# Patient Record
Sex: Male | Born: 1985 | Race: White | Hispanic: No | Marital: Married | Smoking: Never smoker
Health system: Southern US, Community
[De-identification: ages and names within clinical notes are randomized; demographics above are authoritative.]

---

## 2018-09-24 ENCOUNTER — Other Ambulatory Visit: Payer: Self-pay

## 2018-09-24 ENCOUNTER — Emergency Department (INDEPENDENT_AMBULATORY_CARE_PROVIDER_SITE_OTHER)
Admission: EM | Admit: 2018-09-24 | Discharge: 2018-09-24 | Disposition: A | Discharge status: Home / Self Care | Payer: BLUE CROSS/BLUE SHIELD | Source: Home / Self Care | Attending: Emergency Medicine | Admitting: Emergency Medicine

## 2018-09-24 DIAGNOSIS — K219 Gastro-esophageal reflux disease without esophagitis: Secondary | ICD-10-CM | POA: Diagnosis not present

## 2018-09-24 DIAGNOSIS — R0789 Other chest pain: Secondary | ICD-10-CM

## 2018-09-24 MED ORDER — ESOMEPRAZOLE MAGNESIUM 40 MG PO CPDR: 40.0000 mg | DELAYED_RELEASE_CAPSULE | Freq: Every day | ORAL | 0 refills | Status: AC

## 2018-09-24 MED ORDER — ALUM & MAG HYDROXIDE-SIMETH 200-200-20 MG/5ML PO SUSP
30.0000 mL | Freq: Once | ORAL | Status: AC
  Administered 2018-09-24: 30 mL via ORAL

## 2018-09-24 NOTE — ED Triage Notes (Signed)
Pt states that chest tightness started last night.  He feels that his arms are weak, and has had nausea this am.  Pressure in mid chest to left chest today, arm weakness, and "air-headed" now.

## 2018-09-24 NOTE — ED Provider Notes (Signed)
[name]    CSN: [id number] Arrival date & time: 09/24/18  1039     History   Chief Complaint Chief Complaint  Patient presents with  . Chest Pain    HPI Jesse Ramos is a 33 y.o. male.   The history is provided by the patient.  Chest Pain Chest pain location: Vague sensation of mild tightness anterior chest, mid, right, and left and epigastric area.  He is unable to specify further. Pain quality: burning, dull and tightness   Pain radiates to:  Does not radiate Pain severity now: Mild to moderate, varies between 3 or 4 out of 10. Onset quality:  Gradual Timing:  Intermittent Progression:  Waxing and waning Chronicity:  New Context: eating (Ate [place] food last night, causing some epigastric burning" heartburn" and reflux and belching), at rest and stress (Wife is [redacted] weeks pregnant with second child.  [facility]  No other specific stressors that he can identify)   Context: not breathing, not drug use, not lifting, not movement and not trauma   Relieved by: Standing position. Exacerbated by: Lying down. Ineffective treatments:  None tried Associated symptoms: dizziness (No vertigo.  Occasionally vague feeling of lightheadedness.  No syncope.), fatigue, heartburn and numbness (Vague intermittent feeling of numbness in hands and soreness muscles of right and left upper extremities)   Associated symptoms: no abdominal pain (No abdominal pain except occasional epigastric burning sensation), no altered mental status, no back pain, no claudication, no cough, no diaphoresis, no dysphagia, no fever, no headache, no lower extremity edema, no nausea, no palpitations, no shortness of breath, no syncope, no vomiting and no weakness   Risk factors: no coronary artery disease, no diabetes mellitus, no high cholesterol, no hypertension, no prior DVT/PE and no smoking    Denies exertional chest pain. History reviewed. No pertinent past medical  history. He states he has reflux symptoms once or twice a week for many months, can be exacerbated by overeating or eating spicy or greasy foods. There are no active problems to display for this patient.   History reviewed. No pertinent surgical history.  Denies prior surgery.   Home Medications    Prior to Admission medications   Medication Sig Start Date End Date Taking? Authorizing Provider  esomeprazole (NEXIUM) 40 MG capsule Take 1 capsule (40 mg total) by mouth daily. 09/24/18   Lajean ManesMassey, David, MD    Family History History reviewed. No pertinent family history. No family history of coronary disease or cardiovascular disease in parents or siblings. Social History Social History   Tobacco Use  . Smoking status: Never Smoker  . Smokeless tobacco: Never Used  Substance Use Topics  . Alcohol use: Yes    Comment: 1/month  . Drug use: Not Currently     Allergies   Patient has no known allergies.   Review of Systems Review of Systems  Constitutional: Positive for fatigue. Negative for chills, diaphoresis and fever.  HENT: Negative for congestion and trouble swallowing.   Respiratory: Negative for cough, choking and shortness of breath.   Cardiovascular: Positive for chest pain. Negative for palpitations, claudication and syncope.  Gastrointestinal: Positive for heartburn. Negative for abdominal pain (No abdominal pain except occasional epigastric burning sensation), blood in stool, diarrhea, nausea and vomiting.  Genitourinary: Negative for difficulty urinating.  Musculoskeletal: Negative for back pain.  Skin: Negative for rash and wound.  Neurological: Positive for dizziness (No vertigo.  Occasionally vague feeling of lightheadedness.  No syncope.) and numbness (Vague intermittent feeling of numbness in hands and soreness muscles of right and left upper extremities). Negative for weakness and headaches.  Psychiatric/Behavioral: Positive for sleep disturbance (He works  third shift.  Was off last night and had some difficulty trying to sleep last night.).  All other systems reviewed and are negative.    Physical Exam Triage Vital Signs ED Triage Vitals [09/24/18 1104]  Enc Vitals Group     BP (!) 145/84     Pulse Rate (!) 58     Resp 20     Temp      Temp src      SpO2 98 %     Weight      Height      Head Circumference      Peak Flow      Pain Score 4     Pain Loc      Pain Edu?      Excl. in GC?    No data found.  Updated Vital Signs BP (!) 145/84 (BP Location: Right Arm)   Pulse (!) 58   Resp 20   SpO2 98%    Physical Exam Vitals signs and nursing note reviewed.  Constitutional:      General: He is not in acute distress.    Appearance: He is well-developed. He is not toxic-appearing.     Comments: Pleasant male.  Mildly overweight.  Cooperative.  No acute distress.  HENT:     Head: Normocephalic and atraumatic.  Eyes:     Extraocular Movements: Extraocular movements intact.     Pupils: Pupils are equal, round, and reactive to light.  Neck:     Musculoskeletal: Normal range of motion and neck supple.     Vascular: No JVD.     Trachea: No tracheal deviation.  Cardiovascular:     Rate and Rhythm: Normal rate and regular rhythm.  No extrasystoles are present.    Pulses:          Carotid pulses are 2+ on the right side and 2+ on the left side.    Heart sounds: Normal heart sounds. No murmur. No friction rub. No gallop.   Pulmonary:     Effort: Pulmonary effort is normal. No accessory muscle usage or respiratory distress.     Breath sounds: Normal breath sounds. No decreased breath sounds, wheezing or rales.  Chest:     Chest wall: No tenderness.  Abdominal:     General: Bowel sounds are normal.     Palpations: Abdomen is soft. There is no hepatomegaly.     Tenderness: There is no abdominal tenderness.  Musculoskeletal:     Right lower leg: He exhibits no tenderness. No edema.     Left lower leg: He exhibits no  tenderness. No edema.     Comments: Upper extremities within normal limits.  Full range of motion.  No joint swelling or tenderness.  Mild muscle tenderness of the upper arm and shoulder muscles bilaterally but no bony tenderness.  Neurovascular distally intact.  Capillary refill distally both hands normal.  Lymphadenopathy:     Cervical: No cervical adenopathy.  Skin:    General: Skin is warm and dry.     Capillary Refill: Capillary refill takes less than 2 seconds.     Findings: No rash.  Neurological:     General: No focal deficit present.     Mental Status: He is alert and oriented to person, place, and time.  Cranial Nerves: No cranial nerve deficit.     Sensory: Sensation is intact.     Motor: No weakness.     Gait: Gait is intact.      UC Treatments / Results  Labs (all labs ordered are listed, but only abnormal results are displayed) Labs Reviewed - No data to display  EKG   Radiology No results found.  Procedures Procedures (including critical care time)  Medications Ordered in UC Medications  alum & mag hydroxide-simeth (MAALOX/MYLANTA) 200-200-20 MG/5ML suspension 30 mL (30 mLs Oral Given 09/24/18 1121)    Initial Impression / Assessment and Plan / UC Course  I have reviewed the triage vital signs and the nursing notes.  Pertinent labs & imaging results that were available during my care of the patient were reviewed by me and considered in my medical decision making (see chart for details).  Clinical Course as of Sep 25 1643  Wed Sep 24, 2018  1115 History taken and patient examined.  EKG within normal limits.  No acute abnormalities. Pulse ox 98%.-I strongly suspect this is gastritis and reflux.  We will give GI cocktail stat, then reevaluate   [DM]    Clinical Course User Index [DM] Jacqulyn Cane, MD   He was reevaluated.  Chest discomfort improved somewhat and burning epigastric feeling improved somewhat. He stated that he still felt fatigued, I  rechecked BP 132/76, pulse 72 and regular.  Pulse ox 98% on room air.  I reviewed with him that his EKG was within normal limits.  Normal sinus rhythm without any acute abnormalities.  Final Clinical Impressions(s) / UC Diagnoses   Final diagnoses:  Atypical chest pain  Gastroesophageal reflux disease, esophagitis presence not specified     Discharge Instructions     Here in urgent care, your EKG was normal.  Physical exam normal.  Normal blood pressure and normal oxygen level. There is no evidence that your symptoms are from your heart. Most likely, your symptoms are from acid reflux from your stomach coming up your esophagus, causing chest discomfort and other symptoms. Please read information sheets attached, on nonspecific chest pain and gastroesophageal reflux. Stay away from greasy or spicy food. I printed a prescription for Nexium, 1 daily for 15 days cooldown stomach and esophagus acid. You need to follow-up with your PCP within the next 7 to 14 days. If any severe or worsening or new symptoms, go to emergency room.    ED Prescriptions    Medication Sig Dispense Auth. Provider   esomeprazole (NEXIUM) 40 MG capsule Take 1 capsule (40 mg total) by mouth daily. 15 capsule Jacqulyn Cane, MD     AVS printed and reviewed with patient at length, see details above. Also verbal instructions given. Explained there is no sign of any acute heart or lung event. I explained the diagnosis is likely gastroesophageal reflux. Also we discussed the possibility that stress from wife's pregnancy and irregular sleeping habits from working third shift, could possibly be factors in his symptoms, however that would be a diagnosis of exclusion.  Follow-up with your primary care doctor in 5-7 days, or emergency room sooner if symptoms become worse or new symptoms. Precautions discussed. Red flags discussed. Questions invited and answered. Patient voiced understanding and agreement.      Jacqulyn Cane, MD 09/25/18 [phone]

## 2018-09-24 NOTE — Discharge Instructions (Addendum)
Here in urgent care, your EKG was normal.  Physical exam normal.  Normal blood pressure and normal oxygen level. There is no evidence that your symptoms are from your heart. Most likely, your symptoms are from acid reflux from your stomach coming up your esophagus, causing chest discomfort and other symptoms. Please read information sheets attached, on nonspecific chest pain and gastroesophageal reflux. Stay away from greasy or spicy food. I printed a prescription for Nexium, 1 daily for 15 days cooldown stomach and esophagus acid. You need to follow-up with your PCP within the next 7 to 14 days. If any severe or worsening or new symptoms, go to emergency room.

## 2018-09-25 ENCOUNTER — Telehealth: Payer: Self-pay

## 2018-09-25 NOTE — Telephone Encounter (Signed)
Pt did not go to the hospital.  Not feeling as much pressure, but still there.  Dizziness is better also.  Encouraged to follow up in ER if sx worsen.

## 2018-10-04 ENCOUNTER — Emergency Department (HOSPITAL_COMMUNITY)
Admission: EM | Admit: 2018-10-04 | Discharge: 2018-10-04 | Disposition: A | Discharge status: Home / Self Care | Payer: BLUE CROSS/BLUE SHIELD | Attending: Emergency Medicine | Admitting: Emergency Medicine

## 2018-10-04 ENCOUNTER — Emergency Department (HOSPITAL_COMMUNITY): Payer: BLUE CROSS/BLUE SHIELD

## 2018-10-04 ENCOUNTER — Other Ambulatory Visit: Payer: Self-pay

## 2018-10-04 ENCOUNTER — Encounter (HOSPITAL_COMMUNITY): Payer: Self-pay | Admitting: Emergency Medicine

## 2018-10-04 DIAGNOSIS — Z79899 Other long term (current) drug therapy: Secondary | ICD-10-CM | POA: Diagnosis not present

## 2018-10-04 DIAGNOSIS — R1031 Right lower quadrant pain: Secondary | ICD-10-CM | POA: Diagnosis present

## 2018-10-04 LAB — COMPREHENSIVE METABOLIC PANEL
ALT: 38 U/L (ref 0–44)
AST: 24 U/L (ref 15–41)
Albumin: 4.5 g/dL (ref 3.5–5.0)
Alkaline Phosphatase: 45 U/L (ref 38–126)
Anion gap: 10 (ref 5–15)
BUN: 16 mg/dL (ref 6–20)
CO2: 23 mmol/L (ref 22–32)
Calcium: 9.7 mg/dL (ref 8.9–10.3)
Chloride: 107 mmol/L (ref 98–111)
Creatinine, Ser: 0.92 mg/dL (ref 0.61–1.24)
GFR calc Af Amer: >60 mL/min (ref >60)
GFR calc non Af Amer: >60 mL/min (ref >60)
Glucose, Bld: 99 mg/dL (ref 70–99)
Potassium: 4 mmol/L (ref 3.5–5.1)
Sodium: 140 mmol/L (ref 135–145)
Total Bilirubin: 1 mg/dL (ref 0.3–1.2)
Total Protein: 7.4 g/dL (ref 6.5–8.1)

## 2018-10-04 LAB — URINALYSIS, ROUTINE W REFLEX MICROSCOPIC
Bilirubin Urine: NEGATIVE
Glucose, UA: NEGATIVE mg/dL
Hgb urine dipstick: NEGATIVE
Ketones, ur: NEGATIVE mg/dL
Leukocytes,Ua: NEGATIVE
Nitrite: NEGATIVE
Protein, ur: NEGATIVE mg/dL
Specific Gravity, Urine: 1.012 (ref 1.005–1.030)
pH: 7 (ref 5.0–8.0)

## 2018-10-04 LAB — CBC
HCT: 48.7 % (ref 39.0–52.0)
Hemoglobin: 16.6 g/dL (ref 13.0–17.0)
MCH: 28.5 pg (ref 26.0–34.0)
MCHC: 34.1 g/dL (ref 30.0–36.0)
MCV: 83.7 fL (ref 80.0–100.0)
Platelets: 262 10*3/uL (ref 150–400)
RBC: 5.82 MIL/uL — ABNORMAL HIGH (ref 4.22–5.81)
RDW: 12.5 % (ref 11.5–15.5)
WBC: 7.8 10*3/uL (ref 4.0–10.5)
nRBC: 0 % (ref 0.0–0.2)

## 2018-10-04 LAB — LIPASE, BLOOD: Lipase: 37 U/L (ref 11–51)

## 2018-10-04 MED ORDER — IOHEXOL 300 MG/ML  SOLN
100.0000 mL | Freq: Once | INTRAMUSCULAR | Status: AC | PRN
  Administered 2018-10-04: 100 mL via INTRAVENOUS

## 2018-10-04 MED ORDER — SODIUM CHLORIDE 0.9% FLUSH: 3.0000 mL | Freq: Once | INTRAVENOUS | Status: DC

## 2018-10-04 NOTE — ED Triage Notes (Signed)
Pt. Stated, I started having right side pain last night like a cramping feeling all night. This morning I was a little nausea, and then my right side feels like cold and start aching, and its gotten worse.

## 2018-10-04 NOTE — Discharge Instructions (Signed)
You have been seen today for abdominal pain. Please read and follow all provided instructions. Return to the emergency room for worsening condition or new concerning symptoms.    Your blood work today did not show any significant findings. Your CT scan did show possible mesenteric adenitis as well as nonalcoholic fatty liver disease.  I have included information about these diagnoses in your discharge paperwork.  Please read them for more information.  These are not conditions that are treated with antibiotics.  1. Medications:  -You can take Tylenol and ibuprofen as needed for pain.  Please take as directed on the bottle.  2. Treatment: rest, drink plenty of fluids.  You can apply heat to your abdomen if that helps with pain relief.  3. Follow Up: Please follow up with your primary doctor in 2-5 days for discussion of your diagnoses and further evaluation after today's visit; Call today to arrange your follow up.  If you do not have a primary care doctor use the resource guide provided to find one;   ?

## 2018-10-04 NOTE — ED Notes (Signed)
Patient back from CT.

## 2018-10-04 NOTE — ED Notes (Signed)
Pt able to ingest food and fluids. Denied N/V/D. No abd pain.

## 2018-10-04 NOTE — ED Notes (Signed)
Patient transported to CT 

## 2018-10-04 NOTE — ED Provider Notes (Signed)
[facility]ENC[facility] Provider Note   CSN: [id number] Arrival date & time: 10/04/18  1255    History   Chief Complaint Chief Complaint  Patient presents with  . Abdominal Pain    right    HPI Jesse ElbeJoseph Ramos is a 33 y.o. male.     HPI   33 year old male presents today with complaints of abdominal pain.  Patient notes yesterday after eating dinner he developed a cramping sensation is right lower quadrant.  He notes this came and went throughout the night.  He notes today he still feels discomfort and describes it as an ache in the right lower quadrant.  He notes normal bowel movements, normal urination, no fever nausea or vomiting.  No medications prior to arrival.  Last p.o. intake was crackers and water at 11 AM today.  No history of abdominal surgeries.  He denies any chronic health conditions.   History reviewed. No pertinent past medical history.  There are no active problems to display for this patient.   History reviewed. No pertinent surgical history.      Home Medications    Prior to Admission medications   Medication Sig Start Date End Date Taking? Authorizing Provider  Ascorbic Acid (VITAMIN C PO) Take 1 tablet by mouth daily.   Yes [provider]  clobetasol ointment (TEMOVATE) 0.05 % Apply 1 application topically daily as needed (ezcema flares).   Yes [provider]  esomeprazole (NEXIUM) 40 MG capsule Take 1 capsule (40 mg total) by mouth daily. Patient taking differently: Take 40 mg by mouth daily as needed (indigestion/hearburn/acid reflux).  09/24/18  Yes Lajean ManesMassey, David, MD  Omega-3 Fatty Acids (FISH OIL PO) Take 1 capsule by mouth daily.   Yes [provider]    Family History No family history on file.  Social History Social History   Tobacco Use  . Smoking status: Never Smoker  . Smokeless tobacco: Never Used  Substance Use Topics  . Alcohol use: Yes    Comment: 1/month  . Drug use: Not  Currently     Allergies   Patient has no known allergies.   Review of Systems Review of Systems  All other systems reviewed and are negative.   Physical Exam Updated Vital Signs BP 107/64   Pulse (!) 49   Temp 98.2 F (36.8 C) (Oral)   Resp 14   SpO2 97%   Physical Exam Vitals signs and nursing note reviewed.  Constitutional:      Appearance: He is well-developed.  HENT:     Head: Normocephalic and atraumatic.  Eyes:     General: No scleral icterus.       Right eye: No discharge.        Left eye: No discharge.     Conjunctiva/sclera: Conjunctivae normal.     Pupils: Pupils are equal, round, and reactive to light.  Neck:     Musculoskeletal: Normal range of motion.     Vascular: No JVD.     Trachea: No tracheal deviation.  Pulmonary:     Effort: Pulmonary effort is normal.     Breath sounds: No stridor.  Abdominal:     Comments: Minimal tenderness palpation of right lower quadrant no rebound guarding or masses noted, remainder abdomen soft nontender  Neurological:     Mental Status: He is alert and oriented to person, place, and time.     Coordination: Coordination normal.  Psychiatric:        Behavior: Behavior  normal.        Thought Content: Thought content normal.        Judgment: Judgment normal.     ED Treatments / Results  Labs (all labs ordered are listed, but only abnormal results are displayed) Labs Reviewed  CBC - Abnormal; Notable for the following components:      Result Value   RBC 5.82 (*)    All other components within normal limits  URINALYSIS, ROUTINE W REFLEX MICROSCOPIC - Abnormal; Notable for the following components:   Color, Urine STRAW (*)    All other components within normal limits  LIPASE, BLOOD  COMPREHENSIVE METABOLIC PANEL    EKG None  Radiology No results found.  Procedures Procedures (including critical care time)  Medications Ordered in ED Medications  sodium chloride flush (NS) 0.9 % injection 3 mL (3 mLs  Intravenous Not Given 10/04/18 1437)     Initial Impression / Assessment and Plan / ED Course  I have reviewed the triage vital signs and the nursing notes.  Pertinent labs & imaging results that were available during my care of the patient were reviewed by me and considered in my medical decision making (see chart for details).         Assessment/Plan: 33 year old male presents today with complaints of abdominal pain.  He does have pain in the right lower quadrant.  Will obtain CT scan to rule out acute appendicitis.  Also consider nephrolithiasis.  He is afebrile with no elevated white count.  Patient CARE signed to oncoming provider pending CT scan and disposition.   Final Clinical Impressions(s) / ED Diagnoses   Final diagnoses:  RLQ abdominal pain    ED Discharge Orders    None       Francee Gentile 10/06/18 Kersey, Annada, DO 10/07/18 2351

## 2018-10-04 NOTE — ED Notes (Signed)
Patient verbalizes understanding of discharge instructions. Opportunity for questioning and answers were provided. Armband removed by staff, pt discharged from ED.  

## 2018-10-04 NOTE — ED Provider Notes (Signed)
Care assumed from J. Hedges PA-C.  Please see his full H&P.  In short,  Jesse Ramos is a 33 y.o. male presents for right lower quadrant abdominal pain.  Work-up by previous provider is unremarkable including CBC, CMP, lipase, UA.  Patient declines pain medication.  CT abdomen pelvis pending.  Will dispo accordingly.  Physical Exam  BP 107/64   Pulse (!) 49   Temp 98.2 F (36.8 C) (Oral)   Resp 14   SpO2 97%   Physical Exam  PE: Constitutional: well-developed, well-nourished, no apparent distress HENT: normocephalic, atraumatic. no cervical adenopathy Cardiovascular: normal rate and rhythm, distal pulses intact Pulmonary/Chest: effort normal; breath sounds clear and equal bilaterally; no wheezes or rales Abdominal: soft and non distended.  Minimal tenderness palpation of right lower quadrant, no rebound or guarding, no peritoneal signs. Musculoskeletal: full ROM, no edema Neurological: alert with goal directed thinking Skin: warm and dry, no rash, no diaphoresis Psychiatric: normal mood and affect, normal behavior    ED Course/Procedures   Results for orders placed or performed during the hospital encounter of 10/04/18 (from the past 24 hour(s))  Lipase, blood     Status: None   Collection Time: 10/04/18  2:23 PM  Result Value Ref Range   Lipase 37 11 - 51 U/L  Comprehensive metabolic panel     Status: None   Collection Time: 10/04/18  2:23 PM  Result Value Ref Range   Sodium 140 135 - 145 mmol/L   Potassium 4.0 3.5 - 5.1 mmol/L   Chloride 107 98 - 111 mmol/L   CO2 23 22 - 32 mmol/L   Glucose, Bld 99 70 - 99 mg/dL   BUN 16 6 - 20 mg/dL   Creatinine, Ser 1.610.92 0.61 - 1.24 mg/dL   Calcium 9.7 8.9 - 09.610.3 mg/dL   Total Protein 7.4 6.5 - 8.1 g/dL   Albumin 4.5 3.5 - 5.0 g/dL   AST 24 15 - 41 U/L   ALT 38 0 - 44 U/L   Alkaline Phosphatase 45 38 - 126 U/L   Total Bilirubin 1.0 0.3 - 1.2 mg/dL   GFR calc non Af Amer >60 >60 mL/min   GFR calc Af Amer >60 >60 mL/min   Anion  gap 10 5 - 15  CBC     Status: Abnormal   Collection Time: 10/04/18  2:23 PM  Result Value Ref Range   WBC 7.8 4.0 - 10.5 K/uL   RBC 5.82 (H) 4.22 - 5.81 MIL/uL   Hemoglobin 16.6 13.0 - 17.0 g/dL   HCT 04.548.7 40.939.0 - 81.152.0 %   MCV 83.7 80.0 - 100.0 fL   MCH 28.5 26.0 - 34.0 pg   MCHC 34.1 30.0 - 36.0 g/dL   RDW 91.412.5 78.211.5 - 95.615.5 %   Platelets 262 150 - 400 K/uL   nRBC 0.0 0.0 - 0.2 %  Urinalysis, Routine w reflex microscopic     Status: Abnormal   Collection Time: 10/04/18  2:36 PM  Result Value Ref Range   Color, Urine STRAW (A) YELLOW   APPearance CLEAR CLEAR   Specific Gravity, Urine 1.012 1.005 - 1.030   pH 7.0 5.0 - 8.0   Glucose, UA NEGATIVE NEGATIVE mg/dL   Hgb urine dipstick NEGATIVE NEGATIVE   Bilirubin Urine NEGATIVE NEGATIVE   Ketones, ur NEGATIVE NEGATIVE mg/dL   Protein, ur NEGATIVE NEGATIVE mg/dL   Nitrite NEGATIVE NEGATIVE   Leukocytes,Ua NEGATIVE NEGATIVE   CT ABDOMEN AND PELVIS WITH CONTRAST  TECHNIQUE:  Multidetector CT imaging of the abdomen and pelvis was performed  using the standard protocol following bolus administration of  intravenous contrast.    CONTRAST: 163mL OMNIPAQUE IOHEXOL 300 MG/ML SOLN    COMPARISON: None.    FINDINGS:  Lower chest: No acute abnormality.    Hepatobiliary: Liver is low in density suggesting fatty  infiltration. No focal liver abnormality is identified. Gallbladder  appears normal. No bile duct dilatation.    Pancreas: Unremarkable. No pancreatic ductal dilatation or  surrounding inflammatory changes.    Spleen: Normal in size without focal abnormality.    Adrenals/Urinary Tract: Adrenal glands appear normal. Small renal  cysts bilaterally. Kidneys otherwise unremarkable without suspicious  mass, stone or hydronephrosis. No perinephric fluid. No ureteral or  bladder calculi identified. Bladder is unremarkable, partially  decompressed.    Stomach/Bowel: No dilated large or small bowel loops. No  evidence of  bowel wall inflammation. Appendix is normal. Stomach is  unremarkable, partially decompressed.    Vascular/Lymphatic: No vascular abnormality. Mildly prominent lymph  nodes within the RIGHT lower quadrant mesentery, raising the  possibility of a mild mesenteric adenitis.    Reproductive: Prostate is unremarkable.    Other: No free fluid or abscess collection. No free intraperitoneal  air.    Musculoskeletal: No acute or suspicious osseous finding.    IMPRESSION:  1. Normal appendix.  2. Mildly prominent lymph nodes within the RIGHT lower quadrant  mesentery, raising the possibility of a mild mesenteric adenitis.  3. No bowel obstruction or evidence of bowel wall inflammation.  Fluid is present within the nondistended small bowel of the lower  abdomen and upper pelvis which can be a secondary sign of underlying  enteritis.  4. Fatty infiltration of the liver.  5. Remainder of the abdomen and pelvis CT is unremarkable, as  detailed above.      Electronically Signed  By: Franki Cabot M.D.  On: 10/04/2018 19:24      MDM  Patient presents to the ED with complaints of abdominal pain. Patient nontoxic appearing, in no apparent distress, vitals WNL. On exam patient tender to right lower quadrant, no peritoneal signs.  Labs were obtained by previous provider and are unremarkable.  Patient declines any analgesics or anti-emetics.  CT abdomen pelvis shows right mesenteric adenitis, doubt infiltration of the liver and possible enteritis.  No signs of appendicitis or small bowel obstruction.  On repeat abdominal exam patient remains without peritoneal signs. Patient tolerating PO in the emergency department. Will discharge home with supportive measures. I discussed results, treatment plan, need for PCP follow-up, and return precautions with the patient. Provided opportunity for questions, patient confirmed understanding and is in agreement with plan.  Patient is  stable at discharge.  This note was prepared using Dragon voice recognition software and may include unintentional dictation errors due to the inherent limitations of voice recognition software.           Flint Melter 10/04/18 1945    Lacretia Leigh, MD 10/05/18 1114

## 2018-10-04 NOTE — ED Triage Notes (Signed)
Pt. Stated, Donnald Garre had right side pain since yesterday

## 2020-05-13 IMAGING — CT CT ABDOMEN AND PELVIS WITH CONTRAST
2 of 4 series · 16 of 46 positions shown, 18 images · IV contrast (Omni 300)
Comparison: None.

CLINICAL DATA: Abdominal pain. Appendicitis suspected.

EXAM:
CT ABDOMEN AND PELVIS WITH CONTRAST
TECHNIQUE: Multidetector CT imaging of the abdomen and pelvis was performed
using the standard protocol following bolus administration of
intravenous contrast.
CONTRAST:  100mL OMNIPAQUE IOHEXOL 300 MG/ML  SOLN

[Series 3: a/p w/ 5mm · axial · 0.90mm/px · z∈[+2,+487]mm · 13 of 107 slices shown, 15 images]
[im 5/107  soft-tissue]
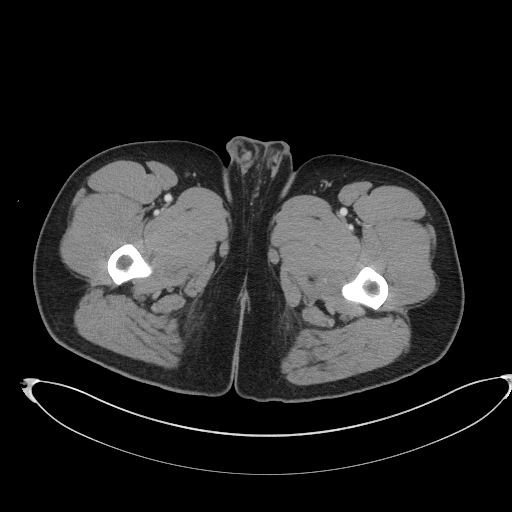
[im 5/107  bone]
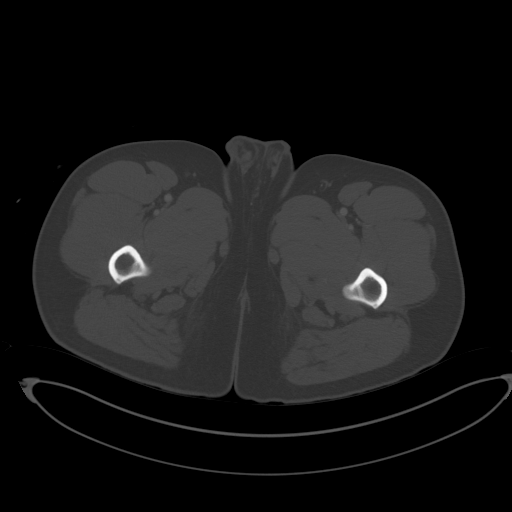
[im 14/107  soft-tissue]
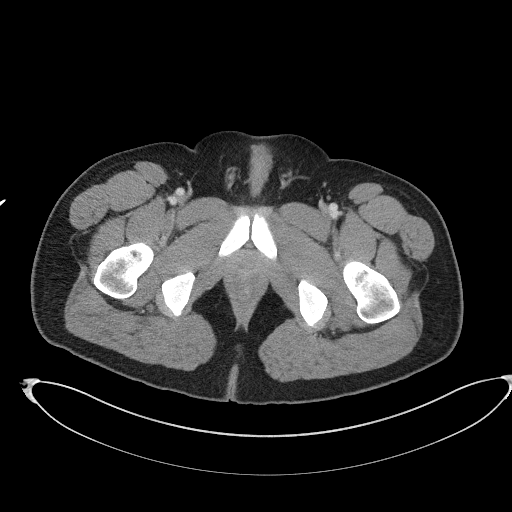
[im 24/107  soft-tissue]
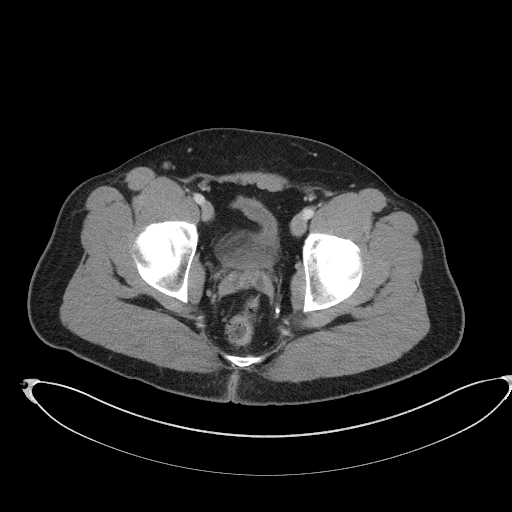
[im 28/107  soft-tissue]
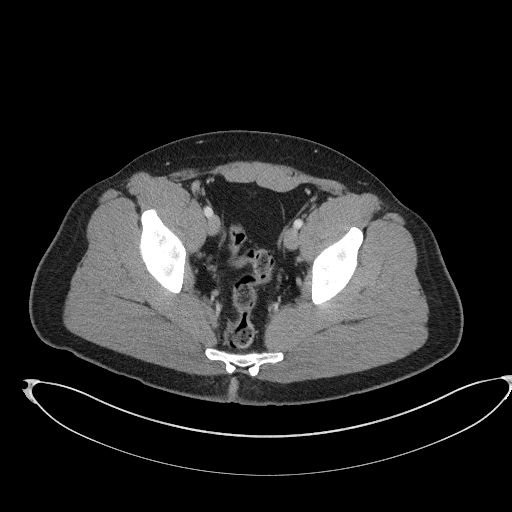
[im 37/107  soft-tissue]
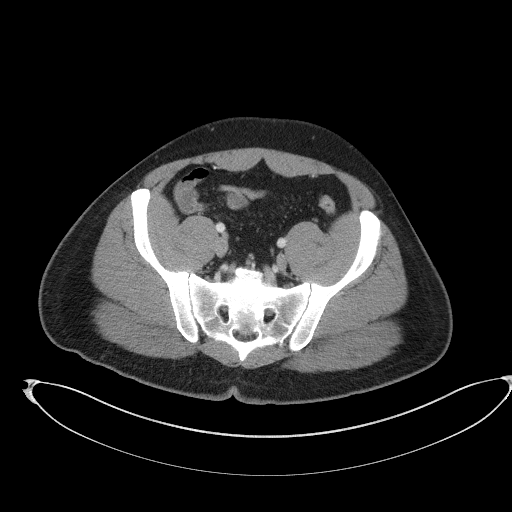
[im 47/107  soft-tissue]
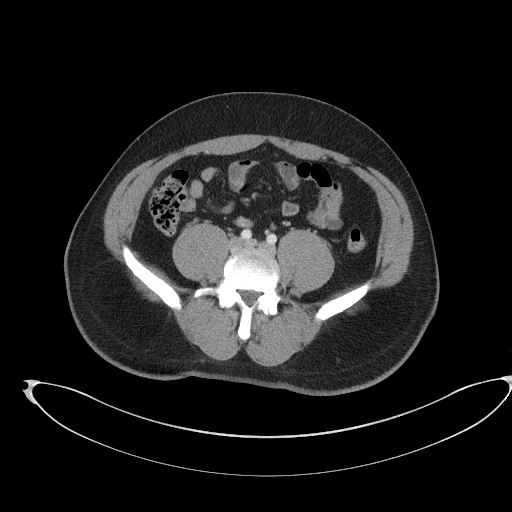
[im 56/107  soft-tissue]
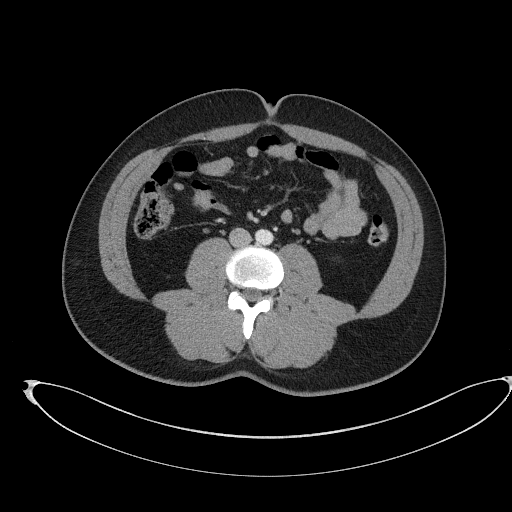
[im 60/107  soft-tissue]
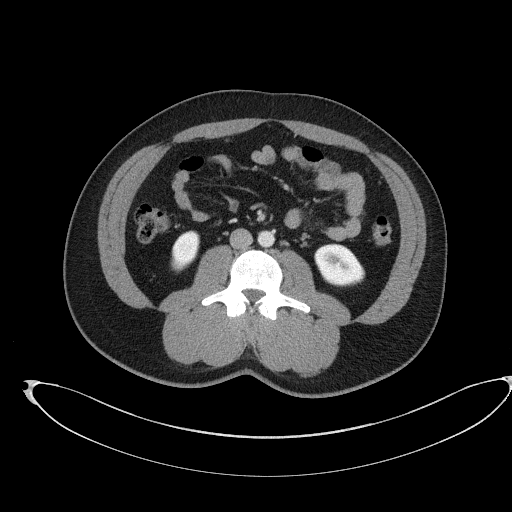
[im 70/107  soft-tissue]
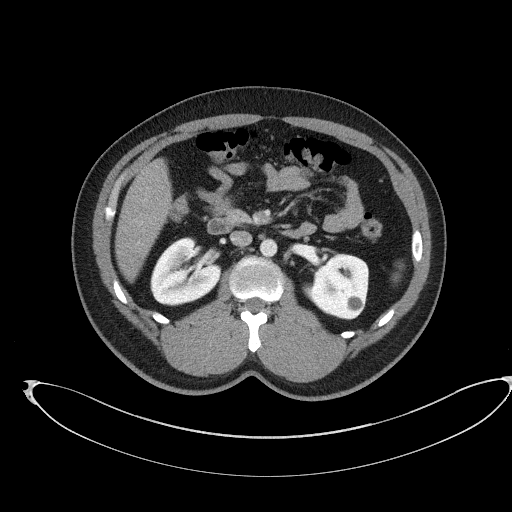
[im 70/107  bone]
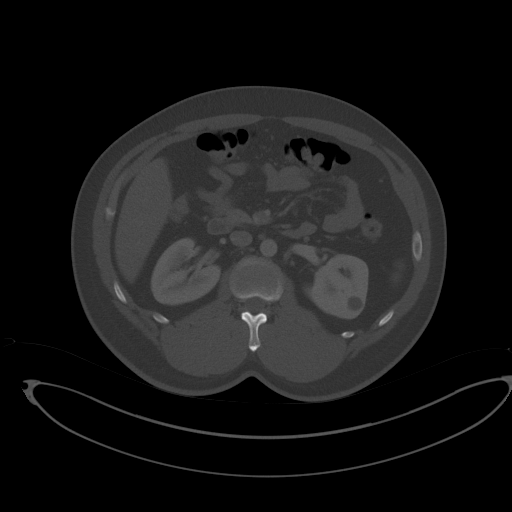
[im 79/107  soft-tissue]
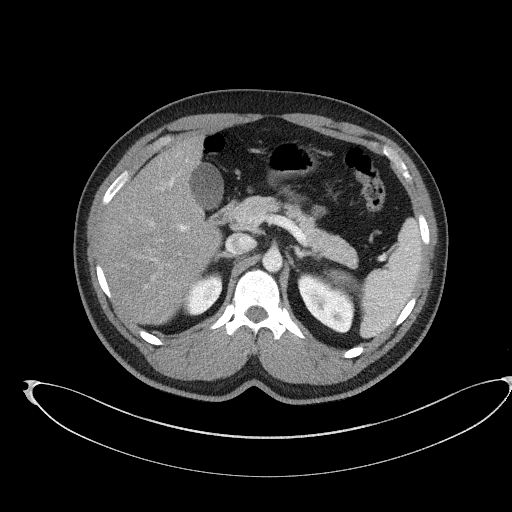
[im 83/107  soft-tissue]
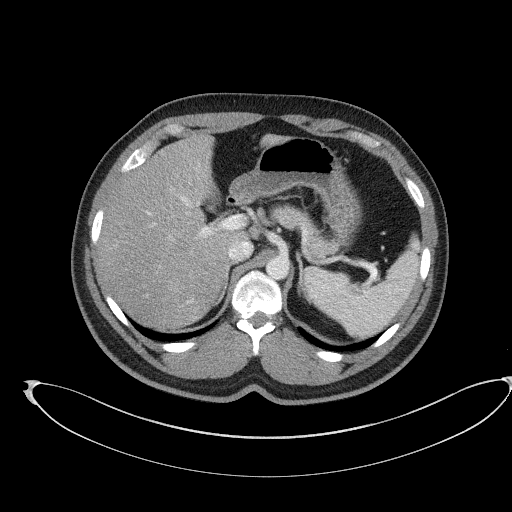
[im 93/107  soft-tissue]
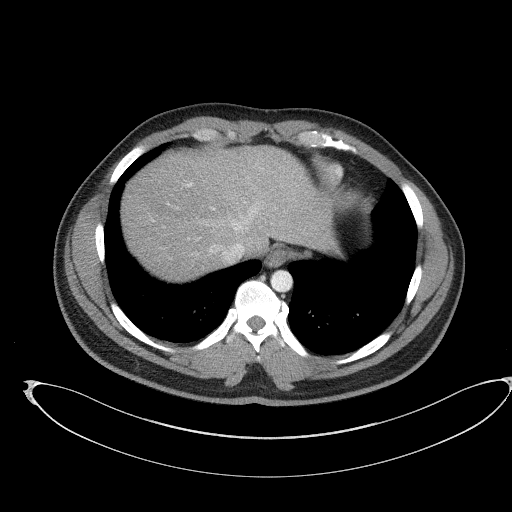
[im 102/107  soft-tissue]
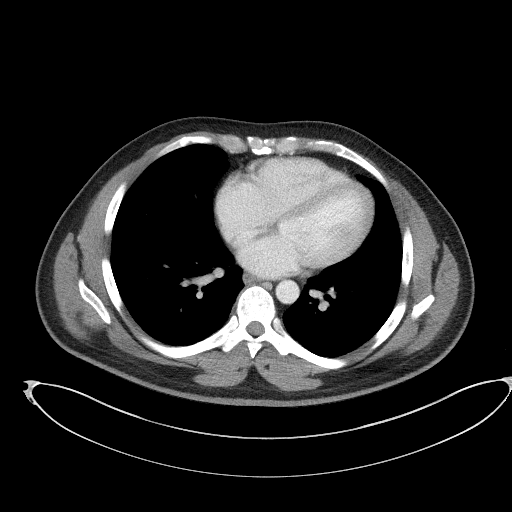

[Series 6: a/p w/ cor · coronal · 1.04mm/px · 3 of 162 slices shown]
[im 54/162  soft-tissue]
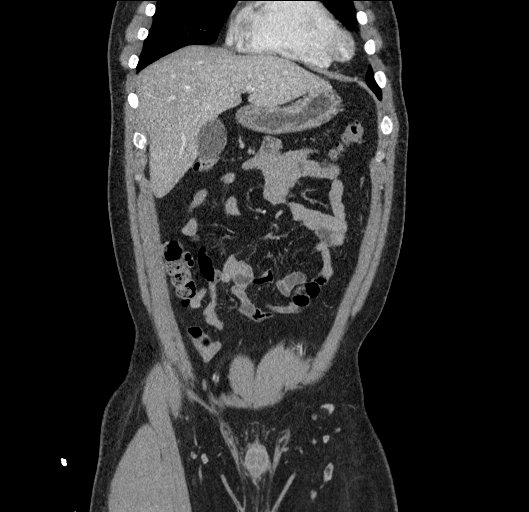
[im 72/162  soft-tissue]
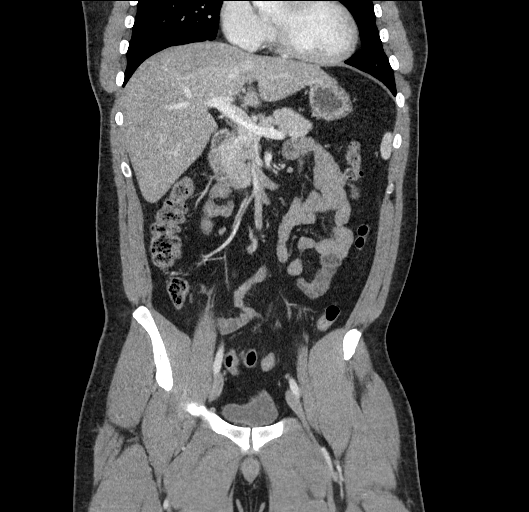
[im 90/162  soft-tissue]
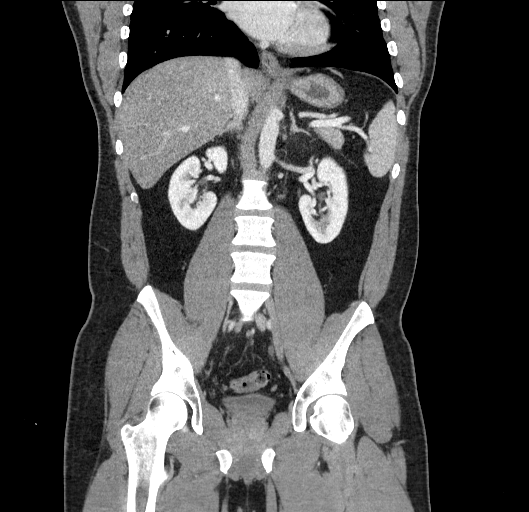

[16 of 46 positions shown; findings below may reference images not displayed]

FINDINGS: Lower chest: No acute abnormality.

Hepatobiliary: Liver is low in density suggesting fatty
infiltration. No focal liver abnormality is identified. Gallbladder
appears normal. No bile duct dilatation.

Pancreas: Unremarkable. No pancreatic ductal dilatation or
surrounding inflammatory changes.

Spleen: Normal in size without focal abnormality.

Adrenals/Urinary Tract: Adrenal glands appear normal. Small renal
cysts bilaterally. Kidneys otherwise unremarkable without suspicious
mass, stone or hydronephrosis. No perinephric fluid. No ureteral or
bladder calculi identified. Bladder is unremarkable, partially
decompressed.

Stomach/Bowel: No dilated large or small bowel loops. No evidence of
bowel wall inflammation. Appendix is normal. Stomach is
unremarkable, partially decompressed.

Vascular/Lymphatic: No vascular abnormality. Mildly prominent lymph
nodes within the RIGHT lower quadrant mesentery, raising the
possibility of a mild mesenteric adenitis.

Reproductive: Prostate is unremarkable.

Other: No free fluid or abscess collection. No free intraperitoneal
air.

Musculoskeletal: No acute or suspicious osseous finding.
IMPRESSION: 1. Normal appendix.
2. Mildly prominent lymph nodes within the RIGHT lower quadrant
mesentery, raising the possibility of a mild mesenteric adenitis.
3. No bowel obstruction or evidence of bowel wall inflammation.
Fluid is present within the nondistended small bowel of the lower
abdomen and upper pelvis which can be a secondary sign of underlying
enteritis.
4. Fatty infiltration of the liver.
5. Remainder of the abdomen and pelvis CT is unremarkable, as
detailed above.
